# Patient Record
Sex: Male | Born: 1975 | Hispanic: Yes | Marital: Single | Smoking: Never smoker
Health system: Southern US, Community
[De-identification: ages and names within clinical notes are randomized; demographics above are authoritative.]

## PROBLEM LIST (undated history)

## (undated) DIAGNOSIS — E119 Type 2 diabetes mellitus without complications: Secondary | ICD-10-CM

## (undated) DIAGNOSIS — I10 Essential (primary) hypertension: Secondary | ICD-10-CM

## (undated) DIAGNOSIS — I2510 Atherosclerotic heart disease of native coronary artery without angina pectoris: Secondary | ICD-10-CM

## (undated) NOTE — ED Notes (Signed)
 Formatting of this note might be different from the original. Pts arm is placed in a sling and patint continues to have good CMS in hand. Pt had some tingling in 4 and 5th fingers that remain there.  Caprice Nevins, RN 06/12/13 [phone] Electronically signed by Caprice Nevins, RN at 06/12/2013  4:20 AM EDT

## (undated) NOTE — ED Notes (Signed)
 Formatting of this note might be different from the original. Wrist was splinted. Pt has good CMS in affected extremity.  Caprice Nevins, RN 06/12/13 [phone] Electronically signed by Caprice Nevins, RN at 06/12/2013  4:09 AM EDT

## (undated) NOTE — ED Provider Notes (Signed)
 Formatting of this note is different from the original.  eMERGENCY dEPARTMENT eNCOUnter    CHIEF COMPLAINT   Chief Complaint  Patient presents with  ? Wrist Injury    [place] and [place] L wrist.   HPI   Alex Reese is a 65 y.o. male who presents to the emergency department complaining of left wrist pain. Patient states that he was playing at the beach when he fell down on outstretched hand causing severe wrist pain. Pain worsened throughout the night and is now extremely swollen. Denies any numbness or weakness. Denies any other injuries.  PAST MEDICAL HISTORY   Past Medical History  Diagnosis Date  ? Hypertension   ? Diabetes mellitus   ? Hyperlipidemia    SURGICAL HISTORY   History reviewed. No pertinent past surgical history.  CURRENT MEDICATIONS   Current Facility-Administered Medications  Medication Dose Route Frequency Provider Last Rate Last Dose  ? ibuprofen (ADVIL,MOTRIN) tablet 600 mg  600 mg Oral Once Lamar LITTIE Salt, DO       Or  ? acetaminophen (TYLENOL) tablet 1,000 mg  1,000 mg Oral Once Lamar LITTIE Salt, DO       Or  ? acetaminophen (TYLENOL) suppository 650 mg  650 mg Rectal Once Lamar LITTIE Salt, DO      ? oxyCODONE-acetaminophen (PERCOCET) 5-325 mg per tablet 2 tablet  2 tablet Oral Once Lamar LITTIE Salt, DO       No current outpatient prescriptions on file.   ALLERGIES   No Known Allergies  FAMILY HISTORY   History reviewed. No pertinent family history.  SOCIAL HISTORY   History   Social History  ? Marital Status: N/A    Spouse Name: N/A    Number of Children: N/A  ? Years of Education: N/A   Social History Main Topics  ? Smoking status: Never Smoker   ? Smokeless tobacco: None  ? Alcohol Use: Yes     Comment: social  ? Drug Use: No  ? Sexually Active: None   Other Topics Concern  ? None   Social History Narrative  ? None   REVIEW OF SYSTEMS   Constitutional:  Denies fever, chills, weight loss or weakness.   Respiratory:  Denies cough or shortness of breath.   Cardiovascular:  Denies chest pain, palpitations or swelling.   Musculoskeletal: Severe left wrist pain, denies other injury  Skin:  Denies rash.   Neurologic:  Denies headache, focal weakness or sensory changes.   See HPI for further details. ROS otherwise negative  PHYSICAL EXAM   VITAL SIGNS: BP 154/100  Pulse 100  Temp(Src) 98.9 F (37.2 C) (Oral)  Resp 18  Ht 5' 5 (1.651 m)  Wt 213 lb (96.616 kg)  BMI 35.44 kg/m2  SpO2 100% Constitutional:  Well developed, well nourished, no acute distress, non-toxic appearance.  Head:  Normocephalic, atraumatic. Eyes:  PEARLA, EOMI. Neck:  Normal range of motion, supple, no stridor.  Respiratory:  Normal breath sounds, no respiratory distress, no wheezing, no chest tenderness.  Cardiovascular:  Normal heart rate, normal rhythm, no murmurs.  GI: Soft, no tenderness, no distention. Extremities:  Left wrist is swollen with slight abnormality, no tenderness in the hand or elbow. No shoulder tenderness, no other injuries noted. Compartments soft, Distal pulses intact, Tendons intact. Back: No midline or CVA tenderness.  Skin:  Warm, dry, no erythema, no rash.  Neurologic:  Alert and oriented.  Moves all extremities spontaneously and reports intact sensation in all  extremities.  RADIOLOGY   Reviewed by me. See official radiology interpretation. X-ray Wrist Left Pa Lateral And Oblique  06/12/2013  Left wrist  3 views of the left wrist demonstrate a comminuted mildly impacted left distal radial metaphyseal fracture with mild dorsal displacement. Radiocarpal alignment is maintained. Other fractures are not observed.    06/12/2013  Conclusion:  Impacted distal left radial fracture as detailed above  Dictated By: Prentice DELENA Buckles, MD 06/12/2013 3:19 AM  Electronically Signed by: Prentice DELENA Buckles, MD 06/12/2013 3:19 AM   PROCEDURES   Splint Sugar tong splint placed on the left lower arm.  Neurovascular intact after splint placement.  ED COURSE & MEDICAL DECISION MAKING   Pertinent labs & imaging studies reviewed. (See chart for details) Patient is from out of town. We will give him followup with or orthopedic surgeon however they will obtain orthopedic followup with in their hometown. Patient understands that this is a most in order for her to properly. May also need surgery.  FINAL IMPRESSION   Left wrist fracture  Lamar LITTIE Salt, DO 06/12/13 [id number] Electronically signed by Lamar LITTIE Salt, DO at 06/12/2013  3:39 AM EDT

---

## 2006-10-06 ENCOUNTER — Emergency Department: Payer: Self-pay | Admitting: Emergency Medicine

## 2008-08-25 ENCOUNTER — Emergency Department: Payer: Self-pay | Admitting: Emergency Medicine

## 2013-06-15 ENCOUNTER — Ambulatory Visit: Payer: Self-pay | Admitting: Family Medicine

## 2013-07-26 ENCOUNTER — Ambulatory Visit: Payer: Self-pay | Admitting: Orthopedic Surgery

## 2013-07-26 LAB — BASIC METABOLIC PANEL
Anion Gap: 5 — ABNORMAL LOW (ref 7–16)
BUN: 14 mg/dL (ref 7–18)
Calcium, Total: 9.3 mg/dL (ref 8.5–10.1)
Chloride: 106 mmol/L (ref 98–107)
Co2: 25 mmol/L (ref 21–32)
Creatinine: 0.72 mg/dL (ref 0.60–1.30)
EGFR (African American): >60
Glucose: 198 mg/dL — ABNORMAL HIGH (ref 65–99)
Osmolality: 278 (ref 275–301)
Potassium: 3.7 mmol/L (ref 3.5–5.1)

## 2013-07-27 ENCOUNTER — Ambulatory Visit: Payer: Self-pay | Admitting: Orthopedic Surgery

## 2015-02-02 NOTE — Op Note (Signed)
PATIENT NAME:  Alex BlaseLEMAN Reese, Alex Reese MR#:  [id number] DATE OF BIRTH:  25-Jun-1976  DATE OF PROCEDURE:  07/27/2013  PREOPERATIVE DIAGNOSIS: Displaced left distal radius fracture.   POSTOPERATIVE DIAGNOSIS: Displaced left distal radius fracture.   PROCEDURE: Open reduction and internal fixation, left distal radius.   ANESTHESIA: General.   SURGEON: Leitha SchullerMichael J. Aalivia Mcgraw, M.D.   DESCRIPTION OF PROCEDURE: The patient was brought to the operating Room and after adequate anesthesia was obtained, the arm was prepped and draped in the usual sterile fashion with a tourniquet applied to the upper arm. After patient identification and timeout procedures were completed, the tourniquet was raised to 250 mmHg. Fingertrap traction was applied to the index and middle fingers with 10 pounds of traction placed. Next, the volar incision was created centered over the FCR tendon. Incision carried down through the skin and subcutaneous tissue with veins being cauterized. The flexor carpi radialis tendon was identified, the tendon sheath incised and the tendon retracted radially. The deep fascia was then incised and the subcutaneous fat retracted ulnarly. The pronator was then elevated off its attachment, and the fracture was essentially healed at this point with very little to no gross motion. Soft tissue was elevated around the fracture medial and lateral on the radial side going around to the dorsal aspect and an osteotome used to break up the callus and try to effect realignment of the fracture. After adequate release of prior callus, adequate length could be restored but getting the distal fragment to displace more ulnarly was more difficult. Partial release of the brachial radialis insertion onto the styloid was carried out, and this aided a great deal in getting the distal fragment in essentially anatomic alignment. A standard short DVR plate was then applied with distal fixation first, fixing the distal fragment with  multiple pegs and multiaxial screws. There appeared to be good alignment and with placing screws in the proximal fragment, the plate was brought down and gave nearly anatomic alignment to the fracture with restoration of the radial length, radial inclination and some volar tilt. After these 3 screw holes had been filled in the shaft, traction was released and the fracture appeared very stable. There was defect secondary to elevating and lengthening the fracture, and a cubic centimeter of bone putty was used to fill this defect. The wound was thoroughly irrigated prior to placing the putty and then tourniquet let down. Hemostasis was checked with electrocautery. The wound was closed with 3-0 Vicryl subcutaneously and 4-0 nylon skin suture. The wound was dressed with Xeroform, 4 x 4's, Webril and a volar splint, followed by an Ace wrap. Tourniquet time was 64 minutes at 250 mmHg. There were no complications. No specimen.   IMPLANTS: [employer] DVR volar plate and screws.   ____________________________ Leitha SchullerMichael J. Matty Deamer, MD mjm:gb D: 07/27/2013 21:39:46 ET T: 07/27/2013 23:16:25 ET JOB#: [id number]  cc: Leitha SchullerMichael J. Charnee Turnipseed, MD, <Dictator> Leitha SchullerMICHAEL J Cedar Roseman MD ELECTRONICALLY SIGNED 07/28/2013 7:30

## 2020-01-19 ENCOUNTER — Other Ambulatory Visit: Payer: Self-pay

## 2020-01-19 MED ORDER — AZELAIC ACID 15 % EX GEL: 1.0000 "application " | Freq: Every day | CUTANEOUS | 1 refills | Status: AC

## 2020-01-19 MED ORDER — CLINDAMYCIN PHOSPHATE 1 % EX LOTN: TOPICAL_LOTION | Freq: Every day | CUTANEOUS | 1 refills | Status: AC

## 2020-01-19 MED ORDER — TRETINOIN 0.1 % EX CREA: TOPICAL_CREAM | Freq: Every evening | CUTANEOUS | 1 refills | Status: AC

## 2020-01-31 ENCOUNTER — Telehealth: Payer: Self-pay

## 2020-01-31 NOTE — Telephone Encounter (Signed)
Molli Knock, that is better than June.  Thanks.

## 2020-01-31 NOTE — Telephone Encounter (Signed)
I have found on opening on May 7th at 9:45.

## 2020-01-31 NOTE — Telephone Encounter (Signed)
Okay patient will have to wait until June until he can come in.

## 2020-01-31 NOTE — Telephone Encounter (Signed)
Is there any way he could be seen?  It looks like I treated him for PFB of scalp and neck in March.  He may have been seen by Dr. Neale Burly and treated for psoriasis before that since it looks like a PA was done for Calcipotriene cream.  He may be referring to clobetesol solution mixed in CeraVe cream, but it would be helpful to document the rash (psoriasis?).  We can also do a f/up for his PFB at the same time.

## 2020-01-31 NOTE — Telephone Encounter (Signed)
Patient called and asked for a RF of his Triamcinolone. At first patient states he used this on his neck and face for acne. Advised patient this was to not be used on the face nor for acne. I did recommend he use his Tretinoin as prescribed in his March visit for his acne. Patient then states he used something before that was mixed with Cerave for his legs and arms and would like a RF of this to help with the itching and he states it helped with the Jaymeson Mengel spots?  Patient was here on 12/20/2019 in [place].   [facility] - 3 mo supply

## 2020-02-17 ENCOUNTER — Ambulatory Visit (INDEPENDENT_AMBULATORY_CARE_PROVIDER_SITE_OTHER): Payer: BC Managed Care – PPO | Admitting: Dermatology

## 2020-02-17 ENCOUNTER — Other Ambulatory Visit: Payer: Self-pay

## 2020-02-17 DIAGNOSIS — L819 Disorder of pigmentation, unspecified: Secondary | ICD-10-CM

## 2020-02-17 DIAGNOSIS — E119 Type 2 diabetes mellitus without complications: Secondary | ICD-10-CM | POA: Diagnosis not present

## 2020-02-17 DIAGNOSIS — L731 Pseudofolliculitis barbae: Secondary | ICD-10-CM

## 2020-02-17 DIAGNOSIS — L810 Postinflammatory hyperpigmentation: Secondary | ICD-10-CM

## 2020-02-17 MED ORDER — HYDROQUINONE 4 % EX CREA: TOPICAL_CREAM | CUTANEOUS | 1 refills | Status: AC

## 2020-02-17 MED ORDER — PIMECROLIMUS 1 % EX CREA: TOPICAL_CREAM | CUTANEOUS | 2 refills | Status: AC

## 2020-02-17 MED ORDER — CLINDAMYCIN PHOSPHATE 1 % EX SOLN: CUTANEOUS | 3 refills | Status: AC

## 2020-02-17 MED ORDER — ADAPALENE 0.3 % EX GEL: CUTANEOUS | 3 refills | Status: AC

## 2020-02-17 MED ORDER — DOXYCYCLINE HYCLATE 100 MG PO TABS: ORAL_TABLET | ORAL | 3 refills | Status: DC

## 2020-02-17 NOTE — Progress Notes (Addendum)
   Follow-Up Visit   Subjective  Alex Reese is a 44 y.o. male who presents for the following: PFB (Improved with medication but patient does continue to flare. He brought TMC 0.1% cream with him today to ask if it is used for his condition. He is currently using Clindamycin lotion, Tretinoin, and Azelaic acid.) and discoloration (of the B/L lower legs which appear after trauma to the area). Patient does experience burning when he uses the Tretinoin at night. He uses TMC cream on neck, jaw.  The following portions of the chart were reviewed this encounter and updated as appropriate:     Review of Systems:  No other skin or systemic complaints except as noted in HPI or Assessment and Plan.  Objective  Well appearing patient in no apparent distress; mood and affect are within normal limits.  A focused examination was performed including Face, neck, scalp. Relevant physical exam findings are noted in the Assessment and Plan.  Objective  B/L lower legs: Hypo and hyperpigmented macules/patches of the lower legs some dyspigmentation around the ankles suspicious for stasis dermatitis.  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 Compression socks when able. Hydroquinone cream 4% QHS to aa's dark spots lower legs BID. If too expensive patient to call and send in [employer] mix.  Recommend daily broad spectrum sunscreen SPF 30+ to sun-exposed areas, reapply every 2 hours as needed.   pimecrolimus (ELIDEL) 1 % cream - B/L lower legs  hydroquinone 4 % cream - B/L lower legs  Pseudofolliculitis barbae Occipital scalp, face  With PIH and continued inflammation - Continue Clindamycin 0.1% solution QAM. Start Adapalene 0.3% gel QHS,  Doxycycline 100mg  po QD, and Elidel cream to aa's QD-BID PRN. D/C Azelaic acid.  D/C TMC cream - chronic use may increase redness, skin thinning.  Doxycycline should be taken with food to prevent nausea. Do not lay down for 30 minutes after taking. Be cautious with sun exposure and use good sun protection while on this medication.   Topical retinoid medications like tretinoin/Retin-A, adapalene/Differin, tazarotene/Fabior, and Epiduo/Epiduo Forte can cause dryness and irritation when first started. Only apply a pea-sized amount to the entire affected area. Avoid applying it around the eyes, edges of mouth and creases at the nose. If you experience irritation, use a good moisturizer first and/or apply the medicine less often. If you are doing well with the medicine, you can increase how often you use it until you are applying every night. Be careful with sun protection while using this medication as it can make you sensitive to the sun.   clindamycin (CLEOCIN T) 1 % external solution - Occipital scalp, face  Adapalene (DIFFERIN) 0.3 % gel - Occipital scalp, face  doxycycline (VIBRA-TABS) 100 MG tablet - Occipital scalp, face  Return in about 3 months (around 05/19/2020) for PFB.  07/19/2020, CMA, am acting as scribe for Maylene Roes, MD .  Documentation: I have reviewed the above documentation for accuracy and completeness, and I agree with the above.  Willeen Niece MD

## 2020-02-17 NOTE — Patient Instructions (Signed)
Topical retinoid medications like tretinoin/Retin-A, adapalene/Differin, tazarotene/Fabior, and Epiduo/Epiduo Forte can cause dryness and irritation when first started. Only apply a pea-sized amount to the entire affected area. Avoid applying it around the eyes, edges of mouth and creases at the nose. If you experience irritation, use a good moisturizer first and/or apply the medicine less often. If you are doing well with the medicine, you can increase how often you use it until you are applying every night. Be careful with sun protection while using this medication as it can make you sensitive to the sun.   Doxycycline should be taken with food to prevent nausea. Do not lay down for 30 minutes after taking. Be cautious with sun exposure and use good sun protection while on this medication.

## 2020-04-17 ENCOUNTER — Other Ambulatory Visit: Payer: Self-pay

## 2020-04-17 ENCOUNTER — Emergency Department: Payer: BC Managed Care – PPO

## 2020-04-17 ENCOUNTER — Encounter: Payer: Self-pay | Admitting: Emergency Medicine

## 2020-04-17 ENCOUNTER — Emergency Department
Admission: EM | Admit: 2020-04-17 | Discharge: 2020-04-17 | Payer: BC Managed Care – PPO | Attending: Emergency Medicine | Admitting: Emergency Medicine

## 2020-04-17 ENCOUNTER — Ambulatory Visit (HOSPITAL_COMMUNITY)
Admission: AD | Admit: 2020-04-17 | Discharge: 2020-04-17 | Disposition: A | Discharge status: Home / Self Care | Payer: BC Managed Care – PPO | Source: Other Acute Inpatient Hospital | Attending: Emergency Medicine | Admitting: Emergency Medicine

## 2020-04-17 ENCOUNTER — Inpatient Hospital Stay (HOSPITAL_COMMUNITY): Admit: 2020-04-17 | Payer: BC Managed Care – PPO

## 2020-04-17 DIAGNOSIS — Z79899 Other long term (current) drug therapy: Secondary | ICD-10-CM | POA: Insufficient documentation

## 2020-04-17 DIAGNOSIS — Y929 Unspecified place or not applicable: Secondary | ICD-10-CM | POA: Insufficient documentation

## 2020-04-17 DIAGNOSIS — Y939 Activity, unspecified: Secondary | ICD-10-CM | POA: Diagnosis not present

## 2020-04-17 DIAGNOSIS — I2510 Atherosclerotic heart disease of native coronary artery without angina pectoris: Secondary | ICD-10-CM | POA: Insufficient documentation

## 2020-04-17 DIAGNOSIS — Y999 Unspecified external cause status: Secondary | ICD-10-CM | POA: Diagnosis not present

## 2020-04-17 DIAGNOSIS — I10 Essential (primary) hypertension: Secondary | ICD-10-CM | POA: Diagnosis not present

## 2020-04-17 DIAGNOSIS — R52 Pain, unspecified: Secondary | ICD-10-CM

## 2020-04-17 DIAGNOSIS — S301XXA Contusion of abdominal wall, initial encounter: Secondary | ICD-10-CM | POA: Insufficient documentation

## 2020-04-17 DIAGNOSIS — S2220XA Unspecified fracture of sternum, initial encounter for closed fracture: Secondary | ICD-10-CM

## 2020-04-17 DIAGNOSIS — S3011XA Contusion of abdominal wall, initial encounter: Secondary | ICD-10-CM

## 2020-04-17 DIAGNOSIS — S2222XA Fracture of body of sternum, initial encounter for closed fracture: Secondary | ICD-10-CM | POA: Insufficient documentation

## 2020-04-17 DIAGNOSIS — E119 Type 2 diabetes mellitus without complications: Secondary | ICD-10-CM | POA: Diagnosis not present

## 2020-04-17 DIAGNOSIS — S299XXA Unspecified injury of thorax, initial encounter: Secondary | ICD-10-CM | POA: Diagnosis present

## 2020-04-17 HISTORY — DX: Type 2 diabetes mellitus without complications: E11.9

## 2020-04-17 HISTORY — DX: Essential (primary) hypertension: I10

## 2020-04-17 HISTORY — DX: Atherosclerotic heart disease of native coronary artery without angina pectoris: I25.10

## 2020-04-17 LAB — GLUCOSE, CAPILLARY: Glucose-Capillary: 134 mg/dL — ABNORMAL HIGH (ref 70–99)

## 2020-04-17 LAB — BASIC METABOLIC PANEL
Anion gap: 11 (ref 5–15)
BUN: 20 mg/dL (ref 6–20)
CO2: 24 mmol/L (ref 22–32)
Calcium: 9.3 mg/dL (ref 8.9–10.3)
Chloride: 103 mmol/L (ref 98–111)
Creatinine, Ser: 0.92 mg/dL (ref 0.61–1.24)
GFR calc Af Amer: >60 mL/min (ref >60)
GFR calc non Af Amer: >60 mL/min (ref >60)
Glucose, Bld: 158 mg/dL — ABNORMAL HIGH (ref 70–99)
Potassium: 3.8 mmol/L (ref 3.5–5.1)
Sodium: 138 mmol/L (ref 135–145)

## 2020-04-17 LAB — CBC WITH DIFFERENTIAL/PLATELET
Abs Immature Granulocytes: 0.04 10*3/uL (ref 0.00–0.07)
Basophils Absolute: 0.1 10*3/uL (ref 0.0–0.1)
Basophils Relative: 0 %
Eosinophils Absolute: 0.1 10*3/uL (ref 0.0–0.5)
Eosinophils Relative: 1 %
HCT: 44.2 % (ref 39.0–52.0)
Hemoglobin: 15.5 g/dL (ref 13.0–17.0)
Immature Granulocytes: 0 %
Lymphocytes Relative: 12 %
Lymphs Abs: 1.4 10*3/uL (ref 0.7–4.0)
MCH: 29.2 pg (ref 26.0–34.0)
MCHC: 35.1 g/dL (ref 30.0–36.0)
MCV: 83.4 fL (ref 80.0–100.0)
Monocytes Absolute: 0.9 10*3/uL (ref 0.1–1.0)
Monocytes Relative: 7 %
Neutro Abs: 9.6 10*3/uL — ABNORMAL HIGH (ref 1.7–7.7)
Neutrophils Relative %: 80 %
Platelets: 232 10*3/uL (ref 150–400)
RBC: 5.3 MIL/uL (ref 4.22–5.81)
RDW: 12 % (ref 11.5–15.5)
WBC: 12 10*3/uL — ABNORMAL HIGH (ref 4.0–10.5)
nRBC: 0 % (ref 0.0–0.2)

## 2020-04-17 MED ORDER — HYDROMORPHONE HCL 1 MG/ML IJ SOLN
1.0000 mg | Freq: Once | INTRAMUSCULAR | Status: AC
  Administered 2020-04-17: 1 mg via INTRAVENOUS
  Filled 2020-04-17: qty 1

## 2020-04-17 MED ORDER — IOHEXOL 300 MG/ML  SOLN
100.0000 mL | Freq: Once | INTRAMUSCULAR | Status: AC | PRN
  Administered 2020-04-17: 100 mL via INTRAVENOUS
  Filled 2020-04-17: qty 100

## 2020-04-17 MED ORDER — ONDANSETRON HCL 4 MG/2ML IJ SOLN
4.0000 mg | Freq: Once | INTRAMUSCULAR | Status: AC
  Administered 2020-04-17: 4 mg via INTRAVENOUS
  Filled 2020-04-17: qty 2

## 2020-04-17 MED ORDER — MORPHINE SULFATE (PF) 4 MG/ML IV SOLN
4.0000 mg | Freq: Once | INTRAVENOUS | Status: AC
  Administered 2020-04-17: 4 mg via INTRAVENOUS
  Filled 2020-04-17: qty 1
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
## 2020-04-17 NOTE — ED Notes (Signed)
As per transport team, pt needs a c-collar before transport. C-collar placed on pt. [facility] has pt on their stretcher.
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
## 2020-04-17 NOTE — ED Notes (Signed)
[facility] has pt and is leaving at this time
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
## 2020-04-17 NOTE — ED Notes (Signed)
Report called to Garwin Brothers, RN

## 2020-04-17 NOTE — ED Notes (Signed)
CT to [facility] to [facility] 

## 2020-04-17 NOTE — ED Notes (Signed)
[facility] and Medical Necessity documentation reviewed at this time and found to be complete per policy; electronic signature obtained from pt for consent to transfer.
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
## 2020-04-17 NOTE — ED Notes (Signed)
[facility] at bedside to take pt
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
## 2020-04-17 NOTE — ED Notes (Signed)
ER provider notified that [facility] asked for a c-collar and that it was placed, she stated that was okay  ER provider talking with [facility] and informed them that c-spine was cleared, but they can have the c-collar for transport.
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
## 2020-04-17 NOTE — ED Notes (Signed)
Report given to [facility]. 
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
## 2020-04-25 ENCOUNTER — Other Ambulatory Visit: Payer: Self-pay | Admitting: Family Medicine

## 2020-04-25 DIAGNOSIS — E041 Nontoxic single thyroid nodule: Secondary | ICD-10-CM

## 2020-05-01 ENCOUNTER — Ambulatory Visit
Admission: RE | Admit: 2020-05-01 | Discharge: 2020-05-01 | Disposition: A | Discharge status: Home / Self Care | Payer: BC Managed Care – PPO | Source: Ambulatory Visit | Attending: Family Medicine | Admitting: Family Medicine

## 2020-05-01 ENCOUNTER — Other Ambulatory Visit: Payer: Self-pay

## 2020-05-01 DIAGNOSIS — E041 Nontoxic single thyroid nodule: Secondary | ICD-10-CM | POA: Diagnosis not present

## 2020-06-11 ENCOUNTER — Ambulatory Visit: Payer: BC Managed Care – PPO | Admitting: Dermatology

## 2020-06-11 ENCOUNTER — Other Ambulatory Visit: Payer: Self-pay

## 2020-06-11 DIAGNOSIS — L819 Disorder of pigmentation, unspecified: Secondary | ICD-10-CM

## 2020-06-11 DIAGNOSIS — L83 Acanthosis nigricans: Secondary | ICD-10-CM | POA: Diagnosis not present

## 2020-06-11 DIAGNOSIS — I872 Venous insufficiency (chronic) (peripheral): Secondary | ICD-10-CM

## 2020-06-11 DIAGNOSIS — L731 Pseudofolliculitis barbae: Secondary | ICD-10-CM

## 2020-06-11 MED ORDER — DOXYCYCLINE HYCLATE 100 MG PO TABS: ORAL_TABLET | ORAL | 3 refills | Status: DC
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
## 2020-06-11 NOTE — Patient Instructions (Addendum)
Avoid picking spots on legs. Once spots are healed, start using hydroquinone 4% cream twice a day to lighten.  Elidel Cream (pimecrolimus cream) - Apply to lower legs twice a day for itching. Recommend compression socks daily.   Continue adapalene 0.3% gel to jaw line and back of scalp every night. Continue clindamycin solution to jaw line and back of scalp every day. Continue doxycycline 100mg  1 pill daily. Continue Neutrogena Acne Wash in shower daily. Avoid shaving close with razor. Recommend using an electric shaver.  Doxycycline should be taken with food to prevent nausea. Do not lay down for 30 minutes after taking. Be cautious with sun exposure and use good sun protection while on this medication. Pregnant women should not take this medication.   Topical retinoid medications like tretinoin/Retin-A, adapalene/Differin, tazarotene/Fabior, and Epiduo/Epiduo Forte can cause dryness and irritation when first started. Only apply a pea-sized amount to the entire affected area. Avoid applying it around the eyes, edges of mouth and creases at the nose. If you experience irritation, use a good moisturizer first and/or apply the medicine less often. If you are doing well with the medicine, you can increase how often you use it until you are applying every night. Be careful with sun protection while using this medication as it can make you sensitive to the sun. This medicine should not be used by pregnant women.    AmLactin Lotion - Apply to dark spots on hands, back of neck. May use under arms, but may cause burning.

## 2020-08-01 ENCOUNTER — Other Ambulatory Visit: Payer: Self-pay

## 2020-08-01 DIAGNOSIS — L731 Pseudofolliculitis barbae: Secondary | ICD-10-CM

## 2020-08-01 MED ORDER — DOXYCYCLINE HYCLATE 100 MG PO TABS: ORAL_TABLET | ORAL | 0 refills | Status: AC

## 2020-08-01 NOTE — Progress Notes (Signed)
Pt requested new pharmacy  

## 2020-09-11 ENCOUNTER — Ambulatory Visit: Payer: BC Managed Care – PPO | Admitting: Dermatology
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
## 2024-05-11 LAB — COLOGUARD: COLOGUARD: NEGATIVE

## 2024-08-01 DIAGNOSIS — N529 Male erectile dysfunction, unspecified: Secondary | ICD-10-CM | POA: Insufficient documentation

## 2024-08-01 NOTE — Progress Notes (Signed)
   08/10/24 9:59 AM   Alex Reese 1976/07/09 [id number]   HPI: 48 y.o. male here for initial evaluation of erectile dysfunction  Symptoms began: +5 years ago Primary issue: attaining and maintaining an erection Libido: reduced 30% Relationship w/ partner: multiple short term partners, women only  Rigidity: 30% SHIM: 5   low T screen -decrease strength endurance, decreased libido  Prior therapies:  Tadalafil 10mg  PRN (tried ~3 times), has not tried with solo only masturbation  Hx of T2DM (A1c ~7.1%, from 9.4 in 01/2023), low back pain, HTN  Tobacco Use: Low Risk  (10/26/2020)   Patient History    Smoking Tobacco Use: Never    Smokeless Tobacco Use: Never    Passive Exposure: Not on file        PMH: Past Medical History:  Diagnosis Date   Coronary artery disease    Diabetes mellitus without complication (HCC)    Hypertension     Surgical History: No past surgical history on file.  Family History: No family history on file.  Social History:  reports that he has never smoked. He has never used smokeless tobacco. He reports current alcohol use. He reports that he does not use drugs.      Physical Exam: BP 125/81 (BP Location: Left Arm, Patient Position: Sitting, Cuff Size: Normal)   Pulse 89   Wt 203 lb (92.1 kg)   SpO2 96%   BMI 33.78 kg/m    Constitutional:  Alert and oriented, No acute distress. Cardiovascular: No clubbing, cyanosis, or edema. Respiratory: Normal respiratory effort, no increased work of breathing. GI: Nondistended Skin: No rashes, bruises or suspicious lesions. Neurologic: Grossly intact, no focal deficits, moving all 4 extremities. Psychiatric: Normal mood and affect.  Laboratory Data: T2DM (A1c ~7.1%, from 9.4 in 01/2023)   Pertinent Imaging: N/A    Assessment & Plan:    ED (erectile dysfunction) of organic origin Assessment & Plan: Hx of T2DM (A1c ~7.1%, from 9.4 in 01/2023) Previously tried 10mg  tadalafil  Multiple  short-term partners  We reviewed the basic tenets of erectile dysfunction management, including normal erectile physiology, common contributing factors, and the spectrum of therapeutic options. Conservative measures such as lifestyle modification, optimization of comorbid conditions, and avoidance of exacerbating medications were discussed. Pharmacologic options including PDE5 inhibitors, intracavernosal or intraurethral therapies, and vacuum erection devices were reviewed, as well as surgical approaches such as penile prosthesis implantation. All questions were answered.  - Possible organic ED although may have component of psychogenic (has multiple short-term partner, possible performance anxiety) - Will try max dose 100 mg sildenafil as needed - Encouraged trials with solo /masturbation-may help exclude performance anxiety with multiple partners - check total AM testosterone - follow up in 4-6 weeks for symptom check / lab review  Orders: -     Testosterone,Free and Total; Future  Other orders -     Sildenafil Citrate; Take 1 tablet (100 mg total) by mouth daily as needed for erectile dysfunction.  Dispense: 12 tablet; Refill: 3      Penne Skye, MD 08/10/2024  [facility] [address], [address] [place], [place] [address] [phone]

## 2024-08-01 NOTE — Assessment & Plan Note (Addendum)
 Hx of T2DM (A1c ~7.1%, from 9.4 in 01/2023) Previously tried 10mg  tadalafil  Multiple short-term partners  We reviewed the basic tenets of erectile dysfunction management, including normal erectile physiology, common contributing factors, and the spectrum of therapeutic options. Conservative measures such as lifestyle modification, optimization of comorbid conditions, and avoidance of exacerbating medications were discussed. Pharmacologic options including PDE5 inhibitors, intracavernosal or intraurethral therapies, and vacuum erection devices were reviewed, as well as surgical approaches such as penile prosthesis implantation. All questions were answered.  - Possible organic ED although may have component of psychogenic (has multiple short-term partner, possible performance anxiety) - Will try max dose 100 mg sildenafil as needed - Encouraged trials with solo /masturbation-may help exclude performance anxiety with multiple partners - check total AM testosterone - follow up in 4-6 weeks for symptom check / lab review

## 2024-08-10 ENCOUNTER — Ambulatory Visit: Admitting: Urology

## 2024-08-10 VITALS — BP 125/81 | HR 89 | Wt 203.0 lb

## 2024-08-10 DIAGNOSIS — N529 Male erectile dysfunction, unspecified: Secondary | ICD-10-CM

## 2024-08-10 MED ORDER — SILDENAFIL CITRATE 100 MG PO TABS: 100.0000 mg | ORAL_TABLET | Freq: Every day | ORAL | 3 refills | Status: AC | PRN

## 2024-08-10 NOTE — Patient Instructions (Addendum)
 Please have labs done before 10am

## 2024-08-11 ENCOUNTER — Other Ambulatory Visit
Admission: RE | Admit: 2024-08-11 | Discharge: 2024-08-11 | Disposition: A | Discharge status: Home / Self Care | Attending: Urology | Admitting: Urology

## 2024-08-11 DIAGNOSIS — N529 Male erectile dysfunction, unspecified: Secondary | ICD-10-CM | POA: Insufficient documentation

## 2024-08-12 LAB — TESTOSTERONE,FREE AND TOTAL
Testosterone, Free: 15.9 pg/mL (ref 6.8–21.5)
Testosterone: 758 ng/dL (ref 264–916)

## 2024-08-30 ENCOUNTER — Encounter: Payer: Self-pay | Admitting: Urology

## 2024-09-19 ENCOUNTER — Ambulatory Visit: Admitting: Urology

## 2024-09-19 NOTE — Progress Notes (Deleted)
 09/26/2024 4:33 PM   Alex Reese 1976-07-09 [id number]  Referring provider: Center, [facility] [address] [place],  [place] [address]  Urological history: 1. Erectile dysfunction - testosterone  level (07/2024) 758  - failed tadalafil 10 mg, on-demand-dosing   No chief complaint on file.  HPI: Alex Reese is a 48 y.o. man who presents today for erectile dysfunction.   Previous records reviewed.  SHIM ***  He does not have confidence that he could get and keep an erection, his erections are not firm enough for penetrative intercourse, he has difficulty maintaining his erections,  and he is not finding intercourse satisfactory for him.  ***  Patient still having spontaneous erections.  ***   He denies any pain or curvature with erections.    He is not able to ejaculate, has pain with ejaculation, and has blood in his ejaculate fluid.   ***  Testosterone  level 758   Cholesterol ***  Hemoglobin A1c (04/2024) 7.8  TSH ***   Tried and failed ***   PMH: Past Medical History:  Diagnosis Date   Coronary artery disease    Diabetes mellitus without complication (HCC)    Hypertension     Surgical History: No past surgical history on file.  Home Medications:  Allergies as of 09/26/2024   No Known Allergies      Medication List        Accurate as of September 19, 2024  4:33 PM. If you have any questions, ask your nurse or doctor.          Adapalene  0.3 % gel Commonly known as: Differin  Apply a thin coat to aa's of the face and post scalp QHS   Azelaic Acid  15 % gel Commonly known as: Finacea  Apply 1 application topically daily. After skin is thoroughly washed and patted dry, gently but thoroughly massage a thin film of azelaic acid  cream into the affected area twice daily, in the morning and evening.   clindamycin  1 % external solution Commonly known as: CLEOCIN  T Apply to aa's of the post scalp and face QAM   doxycycline   100 MG tablet Commonly known as: VIBRA -TABS Take one tab po QD with food and plenty of drink   gabapentin 100 MG capsule Commonly known as: NEURONTIN Take 100 mg by mouth 3 (three) times daily.   hydroquinone  4 % cream Apply to aa's on the lower legs BID   liraglutide 18 MG/3ML Sopn Commonly known as: VICTOZA Inject into the skin.   lisinopril 10 MG tablet Commonly known as: ZESTRIL Take 10 mg by mouth daily.   pimecrolimus  1 % cream Commonly known as: Elidel  Apply to aa's lower legs, post scalp, and face QD-BID PRN   rosuvastatin 10 MG tablet Commonly known as: CRESTOR Take by mouth.   sildenafil  100 MG tablet Commonly known as: VIAGRA  Take 1 tablet (100 mg total) by mouth daily as needed for erectile dysfunction.        Allergies: No Known Allergies  Family History: No family history on file.  Social History:  reports that he has never smoked. He has never used smokeless tobacco. He reports current alcohol use. He reports that he does not use drugs.  ROS: Pertinent ROS in HPI  Physical Exam: There were no vitals taken for this visit.  Constitutional:  Well nourished. Alert and oriented, No acute distress. HEENT: [place] AT, moist mucus membranes.  Trachea midline, no masses. Cardiovascular: No clubbing, cyanosis, or edema. Respiratory: Normal respiratory  effort, no increased work of breathing. GI: Abdomen is soft, non tender, non distended, no abdominal masses. Liver and spleen not palpable.  No hernias appreciated.  Stool sample for occult testing is not indicated.   GU: No CVA tenderness.  No bladder fullness or masses.  Patient with circumcised/uncircumcised phallus. ***Foreskin easily retracted***  Urethral meatus is patent.  No penile discharge. No penile lesions or rashes. Scrotum without lesions, cysts, rashes and/or edema.  Testicles are located scrotally bilaterally. No masses are appreciated in the testicles. Left and right epididymis are normal. Rectal:  Patient with  normal sphincter tone. Anus and perineum without scarring or rashes. No rectal masses are appreciated. Prostate is approximately *** grams, *** nodules are appreciated. Seminal vesicles are normal. Skin: No rashes, bruises or suspicious lesions. Lymph: No cervical or inguinal adenopathy. Neurologic: Grossly intact, no focal deficits, moving all 4 extremities. Psychiatric: Normal mood and affect.  Laboratory Data: See Epic and HPI   I have reviewed the labs.   Pertinent Imaging: N/V  Assessment & Plan:  ***  1. Erectile dysfunction - testosterone  level normal  -I explained that conditions like diabetes, hypertension, coronary artery disease, peripheral vascular disease, smoking, alcohol consumption, age, sleep apnea and BPH can diminish the ability to have an erection *** -I explained the ED may be a risk marker for underlying CVD and he should follow up with PCP for further studies *** -We will obtain a serum testosterone  level at this time; if it is abnormal we will need to repeat the study for confirmation *** -Explained that moderate to vigorous aerobic exercise for 40 minutes 4 times per week can decrease erectile problems caused by physical inactivity, obesity, hypertension, metabolic syndrome and/or cardiovascular diseases *** -We discussed trying a *** different PDE5 inhibitor, intra-urethral suppositories, ICI, vacuum erection devices, Li-SWT, and penile prosthesis implantation -Initiate tadalafil [dose, e.g., 10 mg PO as needed prior to sexual activity, not more than once daily].*** -Discussed risks, benefits, and side effects (headache, flushing, dyspepsia, back pain, rare risk of priapism). -Counseled on contraindications: avoid concurrent nitrate therapy. -Encouraged lifestyle modifications: optimize cardiovascular health, exercise, limit alcohol, avoid smoking. -Patient verbalized understanding and agreement with plan.   No follow-ups on file.  These notes  generated with voice recognition software. I apologize for typographical errors.  CLOTILDA HELON RIGGERS  [facility] [address]  [address] [place], [place] [address] [phone]

## 2024-09-26 ENCOUNTER — Ambulatory Visit: Admitting: Urology

## 2024-09-26 DIAGNOSIS — N529 Male erectile dysfunction, unspecified: Secondary | ICD-10-CM

## 2024-10-24 NOTE — Progress Notes (Unsigned)
 "    10/25/2024 9:51 AM   [name] 1976/07/14 [id number]  Referring provider: Center, [facility] [address] [place],  [place] [address]  Urological history: 1. Erectile dysfunction - testosterone  level (07/2024) 758  - failed tadalafil 10 mg, on-demand-dosing   No chief complaint on file.  HPI: Alex Reese is a 49 y.o. man who presents today for erectile dysfunction.   Previous records reviewed.  SHIM ***  He does not have confidence that he could get and keep an erection, his erections are not firm enough for penetrative intercourse, he has difficulty maintaining his erections,  and he is not finding intercourse satisfactory for him.  ***  Patient still having spontaneous erections.  ***   He denies any pain or curvature with erections.    He is not able to ejaculate, has pain with ejaculation, and has blood in his ejaculate fluid.   ***  Testosterone  level 758   Cholesterol ***  Hemoglobin A1c (04/2024) 7.8  TSH ***   Tried and failed ***   PMH: Past Medical History:  Diagnosis Date   Coronary artery disease    Diabetes mellitus without complication (HCC)    Hypertension     Surgical History: No past surgical history on file.  Home Medications:  Allergies as of 10/25/2024   No Known Allergies      Medication List        Accurate as of October 24, 2024  9:51 AM. If you have any questions, ask your nurse or doctor.          Adapalene  0.3 % gel Commonly known as: Differin  Apply a thin coat to aa's of the face and post scalp QHS   Azelaic Acid  15 % gel Commonly known as: Finacea  Apply 1 application topically daily. After skin is thoroughly washed and patted dry, gently but thoroughly massage a thin film of azelaic acid  cream into the affected area twice daily, in the morning and evening.   clindamycin  1 % external solution Commonly known as: CLEOCIN  T Apply to aa's of the post scalp and face QAM   doxycycline   100 MG tablet Commonly known as: VIBRA -TABS Take one tab po QD with food and plenty of drink   gabapentin 100 MG capsule Commonly known as: NEURONTIN Take 100 mg by mouth 3 (three) times daily.   hydroquinone  4 % cream Apply to aa's on the lower legs BID   liraglutide 18 MG/3ML Sopn Commonly known as: VICTOZA Inject into the skin.   lisinopril 10 MG tablet Commonly known as: ZESTRIL Take 10 mg by mouth daily.   pimecrolimus  1 % cream Commonly known as: Elidel  Apply to aa's lower legs, post scalp, and face QD-BID PRN   rosuvastatin 10 MG tablet Commonly known as: CRESTOR Take by mouth.   sildenafil  100 MG tablet Commonly known as: VIAGRA  Take 1 tablet (100 mg total) by mouth daily as needed for erectile dysfunction.        Allergies: No Known Allergies  Family History: No family history on file.  Social History:  reports that he has never smoked. He has never used smokeless tobacco. He reports current alcohol use. He reports that he does not use drugs.  ROS: Pertinent ROS in HPI  Physical Exam: There were no vitals taken for this visit.  Constitutional:  Well nourished. Alert and oriented, No acute distress. HEENT: [place] AT, moist mucus membranes.  Trachea midline, no masses. Cardiovascular: No clubbing, cyanosis, or edema. Respiratory:  Normal respiratory effort, no increased work of breathing. GI: Abdomen is soft, non tender, non distended, no abdominal masses. Liver and spleen not palpable.  No hernias appreciated.  Stool sample for occult testing is not indicated.   GU: No CVA tenderness.  No bladder fullness or masses.  Patient with circumcised/uncircumcised phallus. ***Foreskin easily retracted***  Urethral meatus is patent.  No penile discharge. No penile lesions or rashes. Scrotum without lesions, cysts, rashes and/or edema.  Testicles are located scrotally bilaterally. No masses are appreciated in the testicles. Left and right epididymis are normal. Rectal:  Patient with  normal sphincter tone. Anus and perineum without scarring or rashes. No rectal masses are appreciated. Prostate is approximately *** grams, *** nodules are appreciated. Seminal vesicles are normal. Skin: No rashes, bruises or suspicious lesions. Lymph: No cervical or inguinal adenopathy. Neurologic: Grossly intact, no focal deficits, moving all 4 extremities. Psychiatric: Normal mood and affect.  Laboratory Data: See Epic and HPI   I have reviewed the labs.   Pertinent Imaging: N/V  Assessment & Plan:  ***  1. Erectile dysfunction - testosterone  level normal  -I explained that conditions like diabetes, hypertension, coronary artery disease, peripheral vascular disease, smoking, alcohol consumption, age, sleep apnea and BPH can diminish the ability to have an erection *** -I explained the ED may be a risk marker for underlying CVD and he should follow up with PCP for further studies *** -We will obtain a serum testosterone  level at this time; if it is abnormal we will need to repeat the study for confirmation *** -Explained that moderate to vigorous aerobic exercise for 40 minutes 4 times per week can decrease erectile problems caused by physical inactivity, obesity, hypertension, metabolic syndrome and/or cardiovascular diseases *** -We discussed trying a *** different PDE5 inhibitor, intra-urethral suppositories, ICI, vacuum erection devices, Li-SWT, and penile prosthesis implantation -Initiate tadalafil [dose, e.g., 10 mg PO as needed prior to sexual activity, not more than once daily].*** -Discussed risks, benefits, and side effects (headache, flushing, dyspepsia, back pain, rare risk of priapism). -Counseled on contraindications: avoid concurrent nitrate therapy. -Encouraged lifestyle modifications: optimize cardiovascular health, exercise, limit alcohol, avoid smoking. -Patient verbalized understanding and agreement with plan.   No follow-ups on file.  These notes  generated with voice recognition software. I apologize for typographical errors.  Alex Reese  [facility] [address]  [address] [place], [place] [address] [phone]  "

## 2024-10-25 ENCOUNTER — Other Ambulatory Visit: Payer: Self-pay | Admitting: Urology

## 2024-10-25 ENCOUNTER — Ambulatory Visit

## 2024-10-25 VITALS — BP 129/80 | HR 81 | Wt 206.0 lb

## 2024-10-25 DIAGNOSIS — N529 Male erectile dysfunction, unspecified: Secondary | ICD-10-CM | POA: Diagnosis not present

## 2024-10-25 MED ORDER — AMBULATORY NON FORMULARY MEDICATION: 0 refills | Status: DC

## 2024-10-25 MED ORDER — AMBULATORY NON FORMULARY MEDICATION: 0 refills | Status: AC

## 2024-10-25 MED ORDER — NONFORMULARY OR COMPOUNDED ITEM: 0 refills | Status: DC

## 2024-10-25 NOTE — Addendum Note (Signed)
 Addended by: GIOVANNI KAUFMANN on: 10/25/2024 11:53 AM   Modules accepted: Orders

## 2024-11-18 NOTE — Progress Notes (Unsigned)
"  ° °  11/18/2024 8:29 AM  Alex Reese 29-Jun-1976 [id number]   Referring provider: Center, [facility] [address] [place],  [place] [address]  Urological history: 1. Erectile dysfunction - testosterone  level (07/2024) 758  - failed tadalafil 10 mg, on-demand-dosing   No chief complaint on file.   HPI: Alex Reese is a 49 y.o. male who presents today for ICI titration.    Previous records reviewed.     He has been experiencing issues with ED for ***.   He is having difficulty with achieving and maintaining erections.  ***  He is no longer having nocturnal tumescence or having morning erections.  *** He reports persistent ED despite the use of PDE5i's.  ***  No history of priapism, Peyronie's disease or penile trauma.  ***   Physical Exam:  There were no vitals taken for this visit.  Constitutional:  Well nourished. Alert and oriented, No acute distress. GU: No CVA tenderness.  No bladder fullness or masses.  Patient with circumcised/uncircumcised phallus. ***Foreskin easily retracted***  Urethral meatus is patent.  No penile discharge. No penile lesions or rashes.  Psychiatric: Normal mood and affect.   Procedure *** Patient's left corpus cavernosum is identified.  An area near the base of the penis is cleansed with rubbing alcohol.  Careful to avoid the dorsal vein, 2 mcg of Trimix (papaverine 30 mg, phentolamine 1 mg and prostaglandin E1 10 mcg, Lot # ***@*** exp # *** is injected at a 90 degree angle into the left *** corpus cavernosum near the base of the penis.  Patient experienced a very firm erection in 15 minutes.    Assessment & Plan:    1.  Erectile dysfunction - taught proper injection technique, including sterile handling, correct anatomical location and dosing - advised to use no more than once in 48-72 hours; instructed to seek care if erection persists beyond 4 hours    No follow-ups on file.  Clotilda Cornwall, PA-C   [facility] [address] [place], [place] [address] [phone]  Time Spent: Total time spent on the day of the encounter to include pre-visit record review, face-to-face time with the patient, and post-visit ordering of tests.  *** minutes.      "

## 2024-11-21 ENCOUNTER — Ambulatory Visit: Admitting: Urology

## 2024-11-21 DIAGNOSIS — N529 Male erectile dysfunction, unspecified: Secondary | ICD-10-CM
